# Patient Record
Sex: Male | Born: 1980 | Race: White | Hispanic: No | Smoking: Never smoker
Health system: Southern US, Community
[De-identification: ages and names within clinical notes are randomized; demographics above are authoritative.]

---

## 2005-07-08 ENCOUNTER — Ambulatory Visit (HOSPITAL_BASED_OUTPATIENT_CLINIC_OR_DEPARTMENT_OTHER): Admission: RE | Admit: 2005-07-08 | Discharge: 2005-07-08 | Payer: Self-pay | Admitting: Orthopaedic Surgery

## 2006-12-04 ENCOUNTER — Emergency Department (HOSPITAL_COMMUNITY): Admission: EM | Admit: 2006-12-04 | Discharge: 2006-12-05 | Payer: Self-pay | Admitting: Emergency Medicine

## 2006-12-06 ENCOUNTER — Emergency Department (HOSPITAL_COMMUNITY): Admission: EM | Admit: 2006-12-06 | Discharge: 2006-12-07 | Payer: Self-pay | Admitting: Emergency Medicine

## 2006-12-08 ENCOUNTER — Emergency Department (HOSPITAL_COMMUNITY): Admission: EM | Admit: 2006-12-08 | Discharge: 2006-12-08 | Payer: Self-pay | Admitting: Emergency Medicine

## 2009-05-29 ENCOUNTER — Emergency Department (HOSPITAL_COMMUNITY): Admission: EM | Admit: 2009-05-29 | Discharge: 2009-05-29 | Payer: Self-pay | Admitting: Emergency Medicine

## 2009-05-29 ENCOUNTER — Ambulatory Visit: Payer: Self-pay | Admitting: Diagnostic Radiology

## 2009-05-29 ENCOUNTER — Emergency Department (HOSPITAL_BASED_OUTPATIENT_CLINIC_OR_DEPARTMENT_OTHER): Admission: EM | Admit: 2009-05-29 | Discharge: 2009-05-29 | Payer: Self-pay | Admitting: Emergency Medicine

## 2010-04-24 LAB — URINALYSIS, ROUTINE W REFLEX MICROSCOPIC
Hgb urine dipstick: NEGATIVE
Nitrite: NEGATIVE
Protein, ur: NEGATIVE mg/dL
pH: 5.5 (ref 5.0–8.0)

## 2010-06-22 NOTE — Op Note (Signed)
NAME:  Randall Mckenzie, Randall Mckenzie               ACCOUNT NO.:  [id number]   MEDICAL RECORD NO.:  [id number]          PATIENT TYPE:  AMB   LOCATION:  [facility]                         FACILITY:  [facility]   PHYSICIAN:  Sharolyn Douglas, M.D.        DATE OF BIRTH:  04-01-1980   DATE OF PROCEDURE:  07/08/2005  DATE OF DISCHARGE:                                 OPERATIVE REPORT   DIAGNOSIS:  Lumbar degenerative disk disease, chronic back and leg pain.   PROCEDURE:  1.  Bilateral L4-5 facet injections.  2.  Lumbar epidural steroid injection.  3.  Fluoroscopy used for facet injections and epidural injection.   SURGEON:  Sharolyn Douglas, M.D.   ASSISTANT:  None.   ANESTHESIA:  MAC plus local.   COMPLICATIONS:  None.   ESTIMATED BLOOD LOSS:  None.   INDICATIONS:  Patient is a pleasant 30 year old male with persistent back  and bilateral leg pain thought to be secondary to degenerative disk disease  at L4-5 and facet arthropathy.  He has failed to respond to all conservative  treatment and now elected to undergo facet injections and epidural injection  in hopes of improving his pain.  Risks and benefits were reviewed.   PROCEDURE:  The patient was identified in the holding area, taken to the  operating room.  He underwent sedation by anesthesia.  He was turned prone.  The back was prepped and draped in the usual sterile fashion.  Fluoroscopy  was brought into the field, and the L4-5 facet joints were visualized on AP  fluoroscopy.  The overlying skin was anesthetized with 1 cc of 1% lidocaine.  A 22 gauge Quincke spinal needle advanced towards the left L4-5 facet joint.  A similar procedure was carried out on the right side of L4-5.  Each joint  was then injected with a 0.5 cc of Omnipaque, confirming intra-articular  spread.  The joints were then injected with a solution of 20 mg of Depo-  Medrol and 3 cc of 1% lidocaine.  We then turned our attention to performing  a lumbar epidural steroid injection at L4-5.  Using  AP fluoroscopy, a 22  gauge Tuohy needle was advanced towards the interlaminar space.  Using  lateral fluoroscopy and the loss of resistance technique, the epidural space  was entered.  Aspiration was performed.  There was no blood or CSF.  Omnipaque 1 cc injected.  There was good epidural spread.  We then injected  a solution of 80 mg of Depo-Medrol, 5 cc of preservative-free lidocaine.  Band-Aids were placed over the puncture sites.  Patient  was turned supine, transferred to recovery in stable condition,  neurologically intact.  Post injection instructions were reviewed.  He will  follow up in two weeks.  A prescription for Vicodin provided, 5 mg, 40  tablets to use as needed for pain.      Sharolyn Douglas, M.D.  Electronically Signed     [facility]/MED[facility]  D:  07/08/2005  T:  07/09/2005  Job:  [id number]

## 2010-11-13 LAB — CULTURE, ROUTINE-ABSCESS

## 2015-08-31 ENCOUNTER — Encounter (HOSPITAL_COMMUNITY): Payer: Self-pay

## 2015-08-31 ENCOUNTER — Emergency Department (HOSPITAL_COMMUNITY)
Admission: EM | Admit: 2015-08-31 | Discharge: 2015-08-31 | Disposition: A | Discharge status: Home / Self Care | Payer: Self-pay | Attending: Emergency Medicine | Admitting: Emergency Medicine

## 2015-08-31 ENCOUNTER — Emergency Department (HOSPITAL_COMMUNITY): Payer: Self-pay

## 2015-08-31 DIAGNOSIS — Y939 Activity, unspecified: Secondary | ICD-10-CM | POA: Insufficient documentation

## 2015-08-31 DIAGNOSIS — Y999 Unspecified external cause status: Secondary | ICD-10-CM | POA: Insufficient documentation

## 2015-08-31 DIAGNOSIS — S161XXA Strain of muscle, fascia and tendon at neck level, initial encounter: Secondary | ICD-10-CM | POA: Insufficient documentation

## 2015-08-31 DIAGNOSIS — Y9289 Other specified places as the place of occurrence of the external cause: Secondary | ICD-10-CM | POA: Insufficient documentation

## 2015-08-31 MED ORDER — NAPROXEN 500 MG PO TABS: 500.0000 mg | ORAL_TABLET | Freq: Two times a day (BID) | ORAL | 0 refills | Status: DC

## 2015-08-31 MED ORDER — NAPROXEN 250 MG PO TABS
500.0000 mg | ORAL_TABLET | Freq: Once | ORAL | Status: AC
  Administered 2015-08-31: 500 mg via ORAL
  Filled 2015-08-31: qty 2

## 2015-08-31 MED ORDER — CYCLOBENZAPRINE HCL 5 MG PO TABS: 5.0000 mg | ORAL_TABLET | Freq: Three times a day (TID) | ORAL | 0 refills | Status: DC | PRN

## 2015-08-31 NOTE — Discharge Instructions (Signed)
Mr. Dantin,  Your neck x-ray was negative for malalignment or fracture. Taking NSAIDs like ibuprofen can help with inflammation. I have prescribed a strong NSAID called naproxen and a muscle relaxant called flexeril. The flexeril can make you sleepy, so do not drive with this medication. If you develop vomiting or worsening headache, please be evaluated again. Pain will likely continue for weeks but should slowly improve with time.
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
## 2015-08-31 NOTE — ED Notes (Signed)
Report given to Chelsea, RN
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
## 2015-08-31 NOTE — ED Notes (Signed)
MD Skeet Simmer at the bedside

## 2015-11-18 ENCOUNTER — Emergency Department (HOSPITAL_COMMUNITY): Payer: Self-pay

## 2015-11-18 ENCOUNTER — Encounter (HOSPITAL_COMMUNITY): Payer: Self-pay

## 2015-11-18 ENCOUNTER — Emergency Department (HOSPITAL_COMMUNITY)
Admission: EM | Admit: 2015-11-18 | Discharge: 2015-11-19 | Disposition: A | Discharge status: Home / Self Care | Payer: Self-pay | Attending: Emergency Medicine | Admitting: Emergency Medicine

## 2015-11-18 DIAGNOSIS — J020 Streptococcal pharyngitis: Secondary | ICD-10-CM | POA: Insufficient documentation

## 2015-11-18 DIAGNOSIS — R509 Fever, unspecified: Secondary | ICD-10-CM

## 2015-11-18 LAB — CBC WITH DIFFERENTIAL/PLATELET
Basophils Absolute: 0 10*3/uL (ref 0.0–0.1)
Basophils Relative: 0 %
Eosinophils Absolute: 0 10*3/uL (ref 0.0–0.7)
Eosinophils Relative: 0 %
HCT: 43.2 % (ref 39.0–52.0)
Hemoglobin: 15.1 g/dL (ref 13.0–17.0)
Lymphocytes Relative: 9 %
Lymphs Abs: 1.6 10*3/uL (ref 0.7–4.0)
MCH: 31.4 pg (ref 26.0–34.0)
MCHC: 35 g/dL (ref 30.0–36.0)
MCV: 89.8 fL (ref 78.0–100.0)
Monocytes Absolute: 1.9 10*3/uL — ABNORMAL HIGH (ref 0.1–1.0)
Monocytes Relative: 10 %
Neutro Abs: 15.2 10*3/uL — ABNORMAL HIGH (ref 1.7–7.7)
Neutrophils Relative %: 81 %
Platelets: 163 10*3/uL (ref 150–400)
RBC: 4.81 MIL/uL (ref 4.22–5.81)
RDW: 12.2 % (ref 11.5–15.5)
WBC: 18.7 10*3/uL — ABNORMAL HIGH (ref 4.0–10.5)

## 2015-11-18 LAB — URINALYSIS, ROUTINE W REFLEX MICROSCOPIC
Bilirubin Urine: NEGATIVE
Glucose, UA: NEGATIVE mg/dL
Hgb urine dipstick: NEGATIVE
Ketones, ur: NEGATIVE mg/dL
Leukocytes, UA: NEGATIVE
Nitrite: NEGATIVE
Protein, ur: 30 mg/dL — AB
Specific Gravity, Urine: 1.018 (ref 1.005–1.030)
pH: 5.5 (ref 5.0–8.0)

## 2015-11-18 LAB — URINE MICROSCOPIC-ADD ON: Bacteria, UA: NONE SEEN

## 2015-11-18 LAB — COMPREHENSIVE METABOLIC PANEL
ALK PHOS: 67 U/L (ref 38–126)
ALT: 29 U/L (ref 17–63)
ANION GAP: 10 (ref 5–15)
AST: 27 U/L (ref 15–41)
Albumin: 3.9 g/dL (ref 3.5–5.0)
BILIRUBIN TOTAL: 0.7 mg/dL (ref 0.3–1.2)
BUN: 12 mg/dL (ref 6–20)
CALCIUM: 9 mg/dL (ref 8.9–10.3)
CO2: 25 mmol/L (ref 22–32)
Chloride: 100 mmol/L — ABNORMAL LOW (ref 101–111)
Creatinine, Ser: 1.24 mg/dL (ref 0.61–1.24)
GFR calc non Af Amer: >60 mL/min (ref >60)
Glucose, Bld: 100 mg/dL — ABNORMAL HIGH (ref 65–99)
POTASSIUM: 3.4 mmol/L — AB (ref 3.5–5.1)
SODIUM: 135 mmol/L (ref 135–145)
TOTAL PROTEIN: 8 g/dL (ref 6.5–8.1)

## 2015-11-18 LAB — I-STAT CG4 LACTIC ACID, ED
LACTIC ACID, VENOUS: 1.3 mmol/L (ref 0.5–1.9)
Lactic Acid, Venous: 1.39 mmol/L (ref 0.5–1.9)

## 2015-11-18 LAB — RAPID STREP SCREEN (MED CTR MEBANE ONLY): Streptococcus, Group A Screen (Direct): POSITIVE — AB

## 2015-11-18 MED ORDER — ACETAMINOPHEN 325 MG PO TABS
ORAL_TABLET | ORAL | Status: AC
  Filled 2015-11-18: qty 2

## 2015-11-18 MED ORDER — ACETAMINOPHEN 325 MG PO TABS
650.0000 mg | ORAL_TABLET | Freq: Once | ORAL | Status: AC
  Administered 2015-11-18: 650 mg via ORAL

## 2015-11-18 MED ORDER — PENICILLIN G BENZATHINE 1200000 UNIT/2ML IM SUSP
1.2000 10*6.[IU] | Freq: Once | INTRAMUSCULAR | Status: AC
  Administered 2015-11-19: 1.2 10*6.[IU] via INTRAMUSCULAR
  Filled 2015-11-18: qty 2

## 2015-11-18 MED ORDER — SODIUM CHLORIDE 0.9 % IV BOLUS (SEPSIS)
1000.0000 mL | Freq: Once | INTRAVENOUS | Status: AC
  Administered 2015-11-18: 1000 mL via INTRAVENOUS

## 2015-11-18 NOTE — ED Triage Notes (Signed)
Onset last night 10p fever, body aches and dizziness.  Temp @ 4am was 104.  Pt has did cold showers, cold packs.  NO other s/s noted.  Pt last took Tylenol 3 hours ago.

## 2015-11-18 NOTE — ED Provider Notes (Signed)
[facility] Provider Note   CSN: [id number] Arrival date & time: 11/18/15  1834   By signing my name below, I, Randall Mckenzie, attest that this documentation has been prepared under the direction and in the presence of Geoffery Lyons, MD. Electronically Signed: Soijett Mckenzie, ED Scribe. 11/18/15. 11:12 PM.   History   Chief Complaint Chief Complaint  Patient presents with  . Fever  . Dizziness    HPI  Randall Mckenzie is a 35 y.o. male who presents to the Emergency Department complaining of fever onset 10 PM last night. Pt reports that he last took his temperature at 4 PM today and his fever was 104. Pt notes that his symptoms began with HA and dizziness last night. Pt notes that he had similar symptoms occur several years ago where he was admitted with a temp of 106 and he is unaware of what the cause of his symptoms were. 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  He states that he is having associated symptoms of diaphoresis, dizziness, HA, and body aches. He states that he has tried advil for the relief of his symptoms. He denies sore throat, cough, vomiting, diarrhea, and any other symptoms.   The history is provided by the patient. No language interpreter was used.    History reviewed. No pertinent past medical history.  There are no active problems to display for this patient.   History reviewed. No pertinent surgical history.     Home Medications    Prior to Admission medications   Medication Sig Start Date End Date Taking? Authorizing Provider  cyclobenzaprine (FLEXERIL) 5 MG tablet Take 1 tablet (5 mg total) by mouth 3 (three) times daily as needed for muscle spasms. 08/31/15   Hillary Percell Boston, MD  naproxen (NAPROSYN) 500 MG tablet Take 1 tablet (500 mg total) by mouth 2 (two) times daily with a meal. 08/31/15   Casey Burkitt, MD    Family History History  reviewed. No pertinent family history.  Social History Social History  Substance Use Topics  . Smoking status: Never Smoker  . Smokeless tobacco: Never Used  . Alcohol use No     Allergies   Review of patient's allergies indicates no known allergies.   Review of Systems Review of Systems  All other systems reviewed and are negative.    Physical Exam Updated Vital Signs BP 124/80   Pulse 117   Temp 98.9 F (37.2 C) (Oral)   Resp 20   SpO2 96%   Physical Exam  Constitutional: He is oriented to person, place, and time. He appears well-developed and well-nourished. No distress.  HENT:  Head: Normocephalic and atraumatic.  Right Ear: Hearing normal.  Left Ear: Hearing normal.  Nose: Nose normal.  Mouth/Throat: Mucous membranes are normal. Oropharyngeal exudate and posterior oropharyngeal erythema present.  Slight exudates to bilateral tonsils. Mild erythema of the PO.  Eyes: Conjunctivae and EOM are normal. Pupils are equal, round, and reactive to light.  Neck: Normal range of motion. Neck supple.  Cardiovascular: Normal rate, regular rhythm, S1 normal, S2 normal and normal heart sounds.  Exam reveals no gallop and no friction rub.   No murmur heard. Pulmonary/Chest: Effort normal and breath sounds normal. No respiratory distress. He has no wheezes. He has no rales. He exhibits no tenderness.  Abdominal: Soft. Normal appearance and bowel sounds are normal. There is no hepatosplenomegaly. There is no tenderness.  There is no rebound, no guarding, no tenderness at McBurney's point and negative Murphy's sign. No hernia.  Musculoskeletal: Normal range of motion.  Neurological: He is alert and oriented to person, place, and time. He has normal strength. No cranial nerve deficit or sensory deficit. Coordination normal. GCS eye subscore is 4. GCS verbal subscore is 5. GCS motor subscore is 6.  Skin: Skin is warm, dry and intact. No rash noted. No cyanosis.  Psychiatric: He has a  normal mood and affect. His speech is normal and behavior is normal. Thought content normal.  Nursing note and vitals reviewed.    ED Treatments / Results  DIAGNOSTIC STUDIES: Oxygen Saturation is 96% on RA, nl by my interpretation.    COORDINATION OF CARE: 11:11 PM Discussed treatment plan with pt at bedside which includes labs, CXR, rapid strep screen, and pt agreed to plan.   Labs (all labs ordered are listed, but only abnormal results are displayed) Labs Reviewed  RAPID STREP SCREEN (NOT AT [facility]) - Abnormal; Notable for the following:       Result Value   Streptococcus, Group A Screen (Direct) POSITIVE (*)    All other components within normal limits  COMPREHENSIVE METABOLIC PANEL - Abnormal; Notable for the following:    Potassium 3.4 (*)    Chloride 100 (*)    Glucose, Bld 100 (*)    All other components within normal limits  URINALYSIS, ROUTINE W REFLEX MICROSCOPIC (NOT AT [facility]) - Abnormal; Notable for the following:    Protein, ur 30 (*)    All other components within normal limits  CBC WITH DIFFERENTIAL/PLATELET - Abnormal; Notable for the following:    WBC 18.7 (*)    Neutro Abs 15.2 (*)    Monocytes Absolute 1.9 (*)    All other components within normal limits  URINE MICROSCOPIC-ADD ON - Abnormal; Notable for the following:    Squamous Epithelial / LPF 0-5 (*)    All other components within normal limits  CULTURE, BLOOD (ROUTINE X 2)  CULTURE, BLOOD (ROUTINE X 2)  URINE CULTURE  MONONUCLEOSIS SCREEN  I-STAT CG4 LACTIC ACID, ED  I-STAT CG4 LACTIC ACID, ED    EKG  EKG Interpretation None       Radiology Dg Chest 2 View  Result Date: 11/18/2015 CLINICAL DATA:  Fever and body aches for 1 day. EXAM: CHEST  2 VIEW COMPARISON:  None. FINDINGS: The cardiomediastinal silhouette is unremarkable. Mild peribronchial thickening is noted. There is no evidence of focal airspace disease, pulmonary edema, suspicious pulmonary nodule/mass, pleural effusion, or  pneumothorax. No acute bony abnormalities are identified. IMPRESSION: Mild peribronchial thickening - likely chronic. No other significant abnormalities. Electronically Signed   By: Harmon PierJeffrey  Hu M.D.   On: 11/18/2015 20:05    Procedures Procedures (including critical care time)  Medications Ordered in ED Medications  acetaminophen (TYLENOL) tablet 650 mg (650 mg Oral Given 11/18/15 2120)  sodium chloride 0.9 % bolus 1,000 mL (1,000 mLs Intravenous New Bag/Given 11/18/15 2330)     Initial Impression / Assessment and Plan / ED Course  I have reviewed the triage vital signs and the nursing notes.  Pertinent labs & imaging results that were available during my care of the patient were reviewed by me and considered in my medical decision making (see chart for details).  Clinical Course    Patient presents with complaints of fever. He was found to be tachycardic in triage and a septic workup was initiated. 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 His examination reveals erythema of the oropharynx with slight exudates on the tonsils. A rapid strep swab was obtained and was positive. He was given Bicillin, intravenous fluids, and will be discharged. He is currently afebrile. To return as needed for any problems.  Final Clinical Impressions(s) / ED Diagnoses   Final diagnoses:  None    New Prescriptions New Prescriptions   No medications on file    I personally performed the services described in this documentation, which was scribed in my presence. The recorded information has been reviewed and is accurate.        Geoffery Lyons, MD 11/19/15 [phone]

## 2015-11-18 NOTE — ED Notes (Signed)
Pt to xray

## 2015-11-19 LAB — MONONUCLEOSIS SCREEN: Mono Screen: NEGATIVE

## 2015-11-19 NOTE — Discharge Instructions (Signed)
Alternate ibuprofen 600 mg with Tylenol 1000 mg every 4 hours as needed for pain or fever.  Return to the emergency department if you develop and inability to swallow, difficulty breathing, or other new and concerning symptoms.

## 2015-11-20 LAB — URINE CULTURE: Culture: NO GROWTH

## 2015-11-23 LAB — CULTURE, BLOOD (ROUTINE X 2)
CULTURE: NO GROWTH
Culture: NO GROWTH

## 2016-07-11 ENCOUNTER — Emergency Department (HOSPITAL_COMMUNITY)
Admission: EM | Admit: 2016-07-11 | Discharge: 2016-07-11 | Disposition: A | Discharge status: Home / Self Care | Payer: Self-pay | Attending: Emergency Medicine | Admitting: Emergency Medicine

## 2016-07-11 ENCOUNTER — Emergency Department (HOSPITAL_COMMUNITY): Payer: Self-pay

## 2016-07-11 ENCOUNTER — Encounter (HOSPITAL_COMMUNITY): Payer: Self-pay | Admitting: Vascular Surgery

## 2016-07-11 ENCOUNTER — Ambulatory Visit (HOSPITAL_COMMUNITY)
Admission: EM | Admit: 2016-07-11 | Discharge: 2016-07-11 | Disposition: A | Discharge status: Home / Self Care | Payer: Self-pay

## 2016-07-11 DIAGNOSIS — N50812 Left testicular pain: Secondary | ICD-10-CM | POA: Insufficient documentation

## 2016-07-11 DIAGNOSIS — N5089 Other specified disorders of the male genital organs: Secondary | ICD-10-CM

## 2016-07-11 DIAGNOSIS — N451 Epididymitis: Secondary | ICD-10-CM

## 2016-07-11 DIAGNOSIS — Z79899 Other long term (current) drug therapy: Secondary | ICD-10-CM | POA: Insufficient documentation

## 2016-07-11 LAB — URINALYSIS, ROUTINE W REFLEX MICROSCOPIC
Bilirubin Urine: NEGATIVE
Glucose, UA: NEGATIVE mg/dL
Hgb urine dipstick: NEGATIVE
Ketones, ur: NEGATIVE mg/dL
LEUKOCYTES UA: NEGATIVE
NITRITE: NEGATIVE
PH: 5 (ref 5.0–8.0)
Protein, ur: NEGATIVE mg/dL
SPECIFIC GRAVITY, URINE: 1.026 (ref 1.005–1.030)

## 2016-07-11 MED ORDER — HYDROCODONE-ACETAMINOPHEN 5-325 MG PO TABS
1.0000 | ORAL_TABLET | Freq: Four times a day (QID) | ORAL | 0 refills | Status: DC | PRN
Start: 2016-07-11 — End: 2023-07-26

## 2016-07-11 MED ORDER — LEVOFLOXACIN 500 MG PO TABS: 500.0000 mg | ORAL_TABLET | Freq: Every day | ORAL | 0 refills | Status: DC

## 2016-07-11 MED ORDER — NAPROXEN 500 MG PO TABS: 500.0000 mg | ORAL_TABLET | Freq: Two times a day (BID) | ORAL | 0 refills | Status: DC

## 2016-07-11 MED ORDER — LEVOFLOXACIN 500 MG PO TABS
500.0000 mg | ORAL_TABLET | Freq: Once | ORAL | Status: AC
  Administered 2016-07-11: 500 mg via ORAL
  Filled 2016-07-11: qty 1

## 2016-07-11 NOTE — ED Notes (Signed)
Pt ambulatory to nurse first, pt requesting update for when he may go back to a room, pt updated, this RN apologized for patient's wait and assured him we would get him back as soon as possible.

## 2016-07-11 NOTE — ED Provider Notes (Signed)
[facility]-EMERGENCY DEPT Provider Note   CSN: [id number] Arrival date & time: 07/11/16  1120  By signing my name below, I, Rosana Fret, attest that this documentation has been prepared under the direction and in the presence of non-physician practitioner, Jaynie Crumble, PA-C. Electronically Signed: Rosana Fret, ED Scribe. 07/11/16. 2:41 PM.  History   Chief Complaint Chief Complaint  Patient presents with  . Groin Swelling   The history is provided by the patient. No language interpreter was used.  HPI Comments: Randall Mckenzie is a 36 y.o. male with a PMHx of testicular torsion, who presents to the Emergency Department complaining of a moderate, gradually worsening area of pain and swelling to the left testicle onset 12 hours ago. No trauma to the area. Pt states pain is exacerbated by ambulation. No treatment tried prior to arrival in the ED. Pt denies penile discharge, urinary symptoms or any other complaints at this time. No fever or chills.  NKDA.   History reviewed. No pertinent past medical history.  There are no active problems to display for this patient.   History reviewed. No pertinent surgical history.     Home Medications    Prior to Admission medications   Medication Sig Start Date End Date Taking? Authorizing Provider  cyclobenzaprine (FLEXERIL) 5 MG tablet Take 1 tablet (5 mg total) by mouth 3 (three) times daily as needed for muscle spasms. 08/31/15   Casey Burkitt, MD  naproxen (NAPROSYN) 500 MG tablet Take 1 tablet (500 mg total) by mouth 2 (two) times daily with a meal. 08/31/15   Casey Burkitt, MD    Family History No family history on file.  Social History Social History  Substance Use Topics  . Smoking status: Never Smoker  . Smokeless tobacco: Never Used  . Alcohol use No     Allergies   Patient has no known allergies.   Review of Systems Review of Systems  Genitourinary: Positive for scrotal swelling and  testicular pain. Negative for difficulty urinating, discharge, dysuria, enuresis, frequency and urgency.     Physical Exam Updated Vital Signs BP (!) 134/92 (BP Location: Right Arm)   Pulse 85   Temp 98.5 F (36.9 C) (Oral)   Resp 16   SpO2 98%   Physical Exam  Constitutional: He is oriented to person, place, and time. He appears well-developed and well-nourished.  HENT:  Head: Normocephalic and atraumatic.  Cardiovascular: Normal rate.   Pulmonary/Chest: Effort normal.  Genitourinary:  Genitourinary Comments: Exam performed with chaperone present. Normal right testicle. Left testicle is mildly swollen, tender to the touch diffusely, no palpable skin induration or fluctuance. No masses. Cremasteric reflex present bilaterally.  Neurological: He is alert and oriented to person, place, and time.  Skin: Skin is warm and dry.  Psychiatric: He has a normal mood and affect.  Nursing note and vitals reviewed.    ED Treatments / Results  DIAGNOSTIC STUDIES: Oxygen Saturation is 98% on RA, normal by my interpretation.   COORDINATION OF CARE: 2:39 PM-Discussed next steps with pt including diagnosis of ependymitis and prescription for antibiotics. Pt verbalized understanding and is agreeable with the plan.   Labs (all labs ordered are listed, but only abnormal results are displayed) Labs Reviewed  URINALYSIS, ROUTINE W REFLEX MICROSCOPIC    EKG  EKG Interpretation None       Radiology US Scrotum  Result Date: 07/11/2016 CLINICAL DATA:  Evaluate for testicular torsion. EXAM: SCROTAL ULTRASOUND DOPPLER ULTRASOUND OF THE TESTICLES TECHNIQUE: Complete ultrasound  examination of the testicles, epididymis, and other scrotal structures was performed. Color and spectral Doppler ultrasound were also utilized to evaluate blood flow to the testicles. COMPARISON:  CT 05/29/2009. FINDINGS: Right testicle Measurements: 4.1 x 2.3 x 2.6 cm. No mass or microlithiasis visualized. Left testicle  Measurements: 4.7 x 2.4 x 3.0 cm. No mass or microlithiasis visualized. Right epididymis:  Normal in size and appearance. Left epididymis: Left epididymis is prominent with increased flow. Increased flow also noted in the left testicle. These findings suggest the possibility of left epididymitis/ orchitis. Hydrocele:  Small left hydrocele. Varicocele:  None visualized. Pulsed Doppler interrogation of both testes demonstrates normal low resistance arterial and venous waveforms bilaterally. Increase flow on the left as stated above noted . IMPRESSION: 1. The left epididymis is prominent with increased Doppler flow. Increase flows also none the left testicle. These findings suggests possibly of left epididymitis/ orchitis. Small left hydrocele. 2.  No evidence of testicular mass or torsion. Electronically Signed   By: Maisie Fushomas  Register   On: 07/11/2016 12:51   [place] Art/ven Flow Abd Pelv Doppler  Result Date: 07/11/2016 CLINICAL DATA:  Evaluate for testicular torsion. EXAM: SCROTAL ULTRASOUND DOPPLER ULTRASOUND OF THE TESTICLES TECHNIQUE: Complete ultrasound examination of the testicles, epididymis, and other scrotal structures was performed. Color and spectral Doppler ultrasound were also utilized to evaluate blood flow to the testicles. COMPARISON:  CT 05/29/2009. FINDINGS: Right testicle Measurements: 4.1 x 2.3 x 2.6 cm. No mass or microlithiasis visualized. Left testicle Measurements: 4.7 x 2.4 x 3.0 cm. No mass or microlithiasis visualized. Right epididymis:  Normal in size and appearance. Left epididymis: Left epididymis is prominent with increased flow. Increased flow also noted in the left testicle. These findings suggest the possibility of left epididymitis/ orchitis. Hydrocele:  Small left hydrocele. Varicocele:  None visualized. Pulsed Doppler interrogation of both testes demonstrates normal low resistance arterial and venous waveforms bilaterally. Increase flow on the left as stated above noted . IMPRESSION:  1. The left epididymis is prominent with increased Doppler flow. Increase flows also none the left testicle. These findings suggests possibly of left epididymitis/ orchitis. Small left hydrocele. 2.  No evidence of testicular mass or torsion. Electronically Signed   By: Maisie Fushomas  Register   On: 07/11/2016 12:51    Procedures Procedures (including critical care time)  Medications Ordered in ED Medications - No data to display   Initial Impression / Assessment and Plan / ED Course  I have reviewed the triage vital signs and the nursing notes.  Pertinent labs & imaging results that were available during my care of the patient were reviewed by me and considered in my medical decision making (see chart for details).     Patient in emergency department with acute onset of left testicle pain. Reports possible torsion several years ago but states he has not needed surgery for that. Ultrasound performed in triage showing findings consistent with epididymitis/orchitis. Urinalysis is negative. Patient is nontoxic-appearing, afebrile, normal vital signs. No evidence of systemic infection. No evidence of abscess on exam. Will start on levofloxacin, home with a urology follow-up. Strict return precautions discussed. Patient denies any possibility of STD, gonorrhea and Chlamydia culture sent.  Vitals:   07/11/16 1135  BP: (!) 134/92  Pulse: 85  Resp: 16  Temp: 98.5 F (36.9 C)  TempSrc: Oral  SpO2: 98%    Final Clinical Impressions(s) / ED Diagnoses   Final diagnoses:  Testicle swelling  Epididymitis, bilateral    New Prescriptions New Prescriptions  HYDROCODONE-ACETAMINOPHEN (NORCO) 5-325 MG TABLET    Take 1 tablet by mouth every 6 (six) hours as needed for moderate pain.   LEVOFLOXACIN (LEVAQUIN) 500 MG TABLET    Take 1 tablet (500 mg total) by mouth daily.   NAPROXEN (NAPROSYN) 500 MG TABLET    Take 1 tablet (500 mg total) by mouth 2 (two) times daily.   I personally performed the  services described in this documentation, which was scribed in my presence. The recorded information has been reviewed and is accurate.     Jaynie Crumble, PA-C 07/11/16 1453    Tilden Fossa, MD 07/12/16 [phone]

## 2016-07-11 NOTE — ED Triage Notes (Signed)
Complains of testicular swelling.  Onset 3 am this morning.  Patient does have pain

## 2016-07-11 NOTE — Discharge Instructions (Signed)
Take levaquin as prescribed until all gone. Elevate and support scrotum. Naprosyn for pain and inflammation. Take norco for severe pain only. Follow up with urology

## 2016-07-11 NOTE — ED Triage Notes (Signed)
Pt reports to the ED for eval of swollen left testicle. [facility] and was sent here for an U/S. Onset swelling was around 3 am this am. Onset of pain was gradual. Pt denies any trauma or injury to his testicles. Pt has hx of testicular torsion. He states this pain is different but [facility].

## 2016-07-11 NOTE — ED Notes (Signed)
Ander SladeBill oxford, np spoke with patient.  Patient chose to go to ed for further evaluation
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
## 2018-03-11 IMAGING — US US SCROTUM
1 series · 13 of 25 positions shown · non-contrast
Comparison: CT 05/29/2009.

CLINICAL DATA: Evaluate for testicular torsion.

EXAM:
SCROTAL ULTRASOUND
DOPPLER ULTRASOUND OF THE TESTICLES
TECHNIQUE: Complete ultrasound examination of the testicles, epididymis, and
other scrotal structures was performed. Color and spectral Doppler
ultrasound were also utilized to evaluate blood flow to the
testicles.

[Series 1: us scrotum · 0.06mm/px · 13 of 61 slices shown]
[im 1/61]
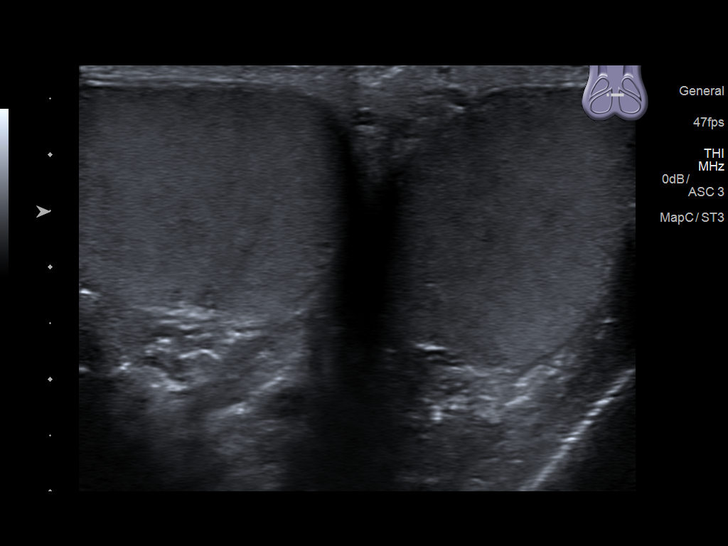
[im 6/61]
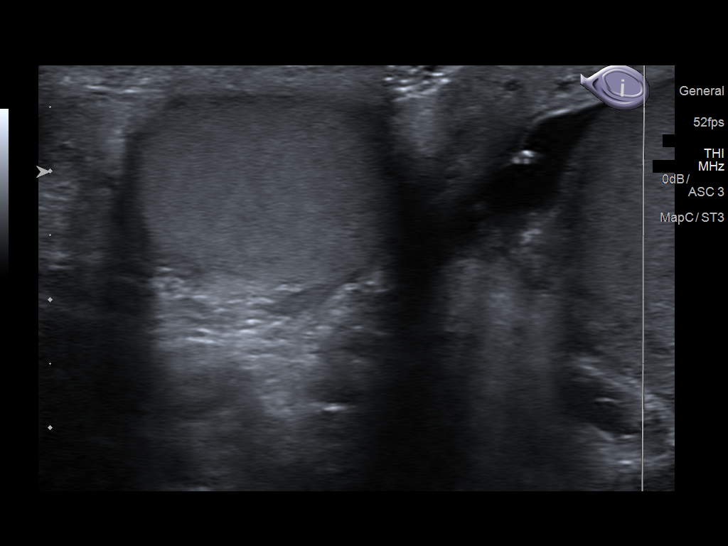
[im 11/61]
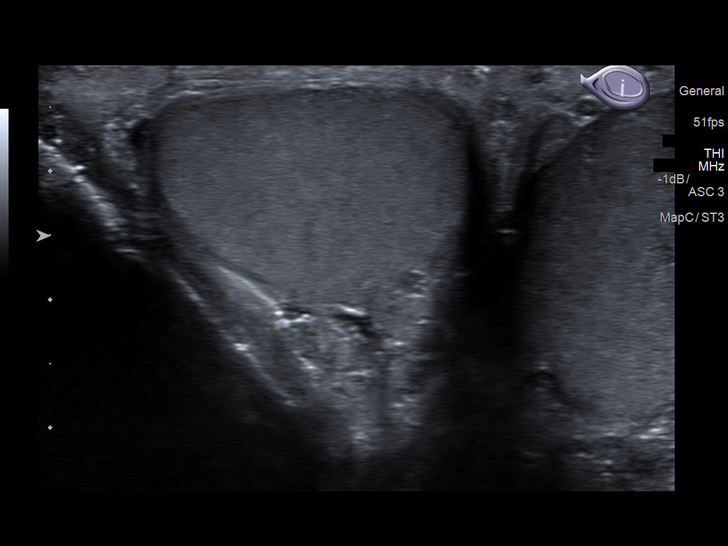
[im 16/61]
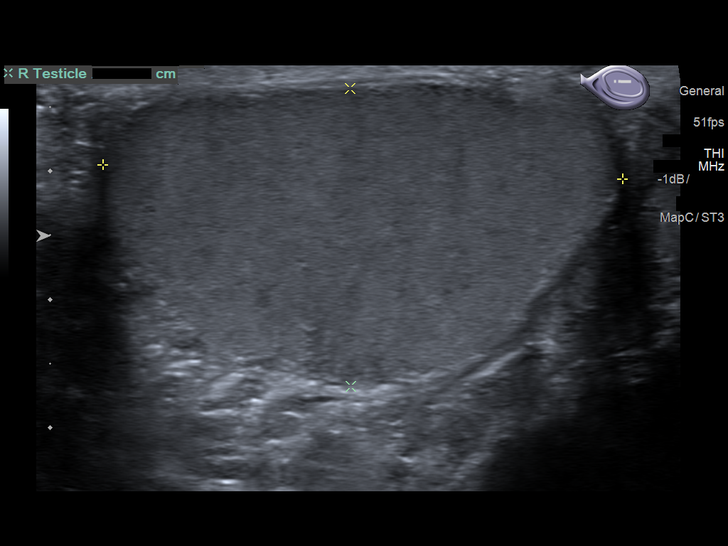
[im 21/61]
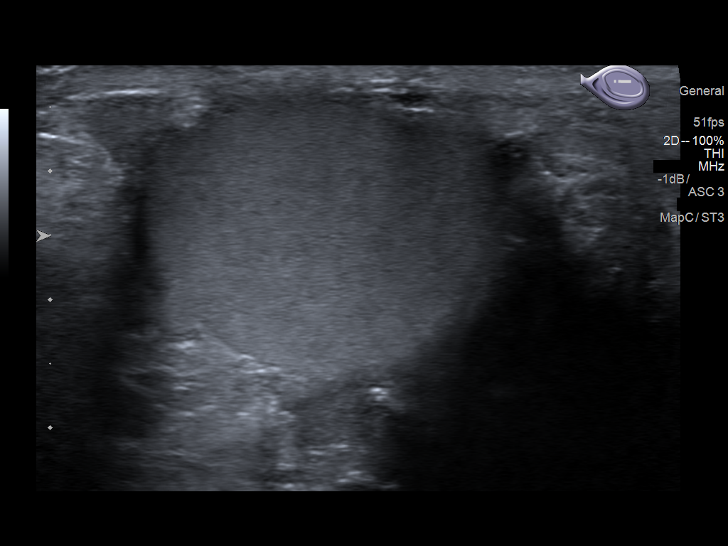
[im 26/61]
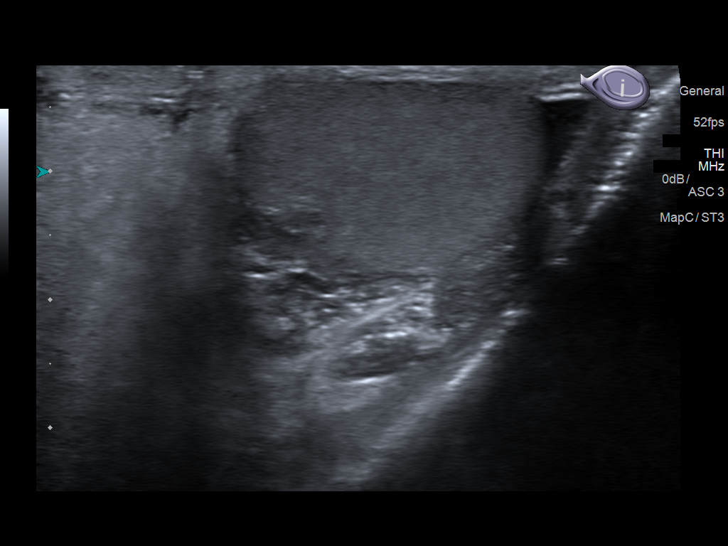
[im 31/61]
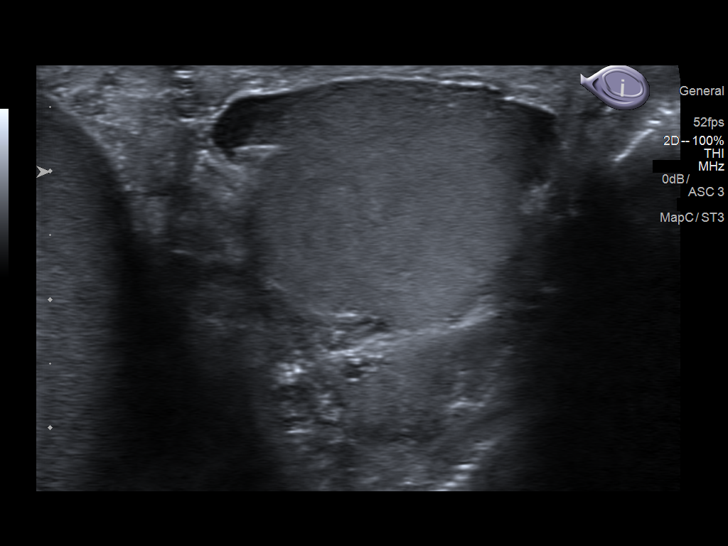
[im 36/61]
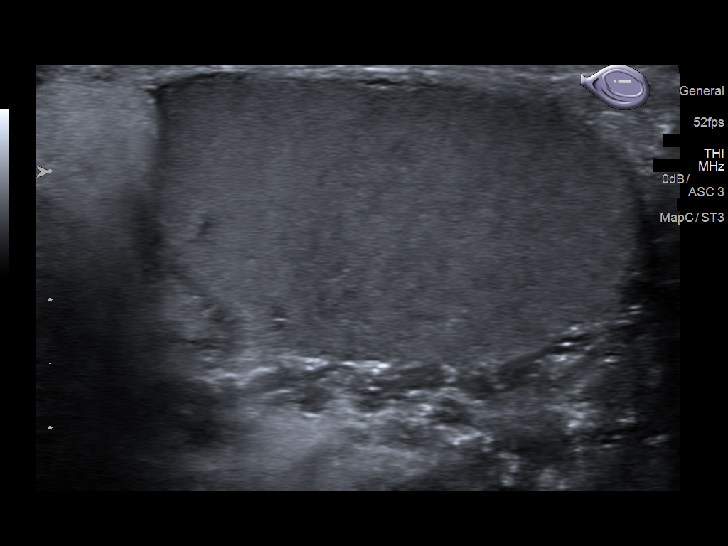
[im 41/61]
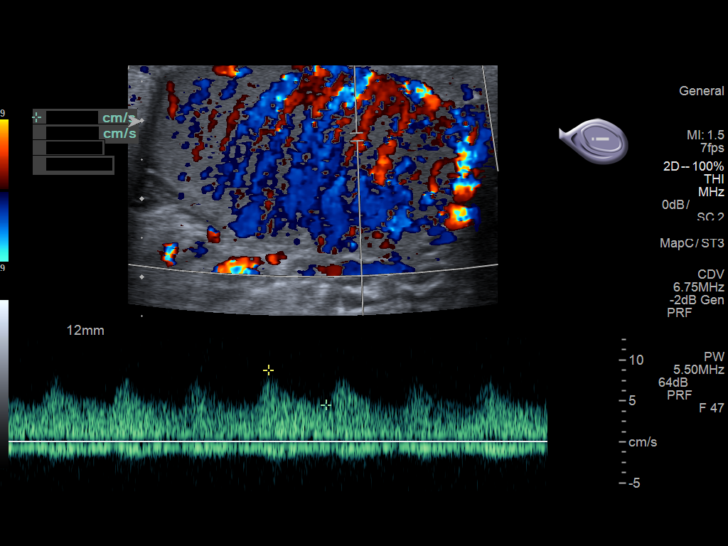
[im 46/61]
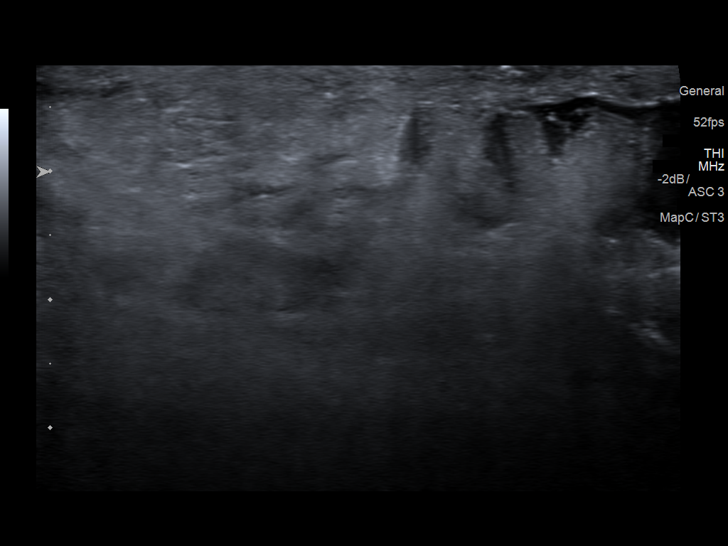
[im 51/61]
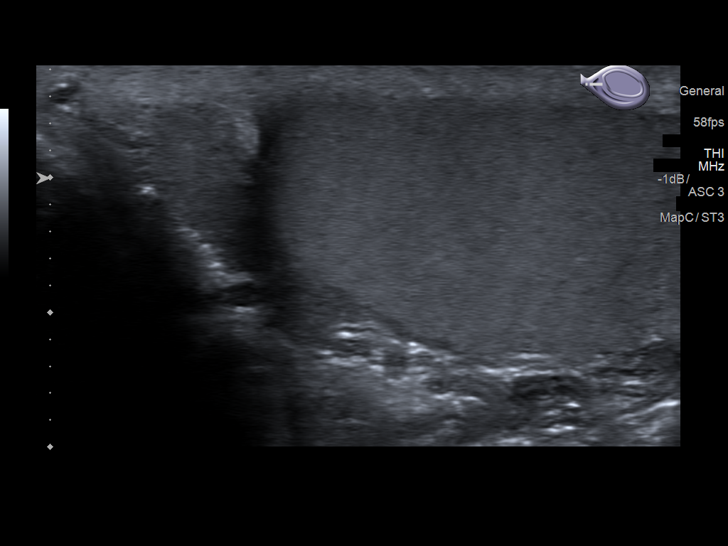
[im 56/61]
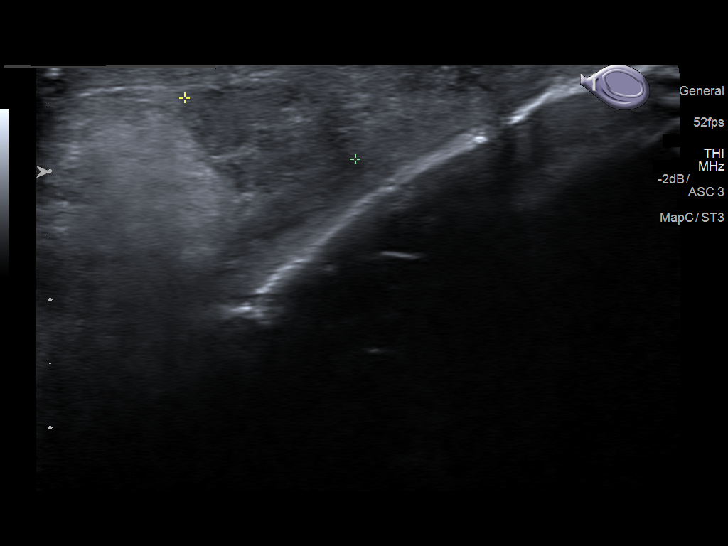
[im 61/61]
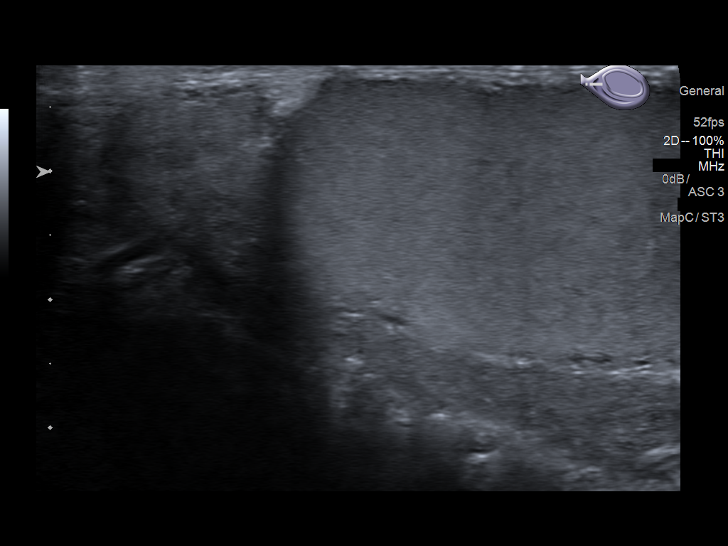

[13 of 25 positions shown; findings below may reference images not displayed]

FINDINGS: Right testicle

Measurements: 4.1 x 2.3 x 2.6 cm. No mass or microlithiasis
visualized.

Left testicle

Measurements: 4.7 x 2.4 x 3.0 cm. No mass or microlithiasis
visualized.

Right epididymis:  Normal in size and appearance.

Left epididymis: Left epididymis is prominent with increased flow.
Increased flow also noted in the left testicle. These findings
suggest the possibility of left epididymitis/ orchitis.

Hydrocele:  Small left hydrocele.

Varicocele:  None visualized.

Pulsed Doppler interrogation of both testes demonstrates normal low
resistance arterial and venous waveforms bilaterally. Increase flow
on the left as stated above noted .
IMPRESSION: 1. The left epididymis is prominent with increased Doppler flow.
Increase flows also none the left testicle. These findings suggests
possibly of left epididymitis/ orchitis. Small left hydrocele.

2.  No evidence of testicular mass or torsion.

## 2023-07-26 ENCOUNTER — Encounter (HOSPITAL_BASED_OUTPATIENT_CLINIC_OR_DEPARTMENT_OTHER): Payer: Self-pay | Admitting: Emergency Medicine

## 2023-07-26 ENCOUNTER — Emergency Department (HOSPITAL_BASED_OUTPATIENT_CLINIC_OR_DEPARTMENT_OTHER): Payer: Self-pay

## 2023-07-26 ENCOUNTER — Emergency Department (HOSPITAL_BASED_OUTPATIENT_CLINIC_OR_DEPARTMENT_OTHER)
Admission: EM | Admit: 2023-07-26 | Discharge: 2023-07-26 | Disposition: A | Discharge status: Home / Self Care | Payer: Self-pay | Attending: Emergency Medicine | Admitting: Emergency Medicine

## 2023-07-26 ENCOUNTER — Other Ambulatory Visit: Payer: Self-pay

## 2023-07-26 DIAGNOSIS — J181 Lobar pneumonia, unspecified organism: Secondary | ICD-10-CM | POA: Insufficient documentation

## 2023-07-26 DIAGNOSIS — R42 Dizziness and giddiness: Secondary | ICD-10-CM | POA: Insufficient documentation

## 2023-07-26 DIAGNOSIS — J189 Pneumonia, unspecified organism: Secondary | ICD-10-CM

## 2023-07-26 LAB — CBC WITH DIFFERENTIAL/PLATELET
Abs Immature Granulocytes: 0.04 10*3/uL (ref 0.00–0.07)
Basophils Absolute: 0 10*3/uL (ref 0.0–0.1)
Basophils Relative: 1 %
Eosinophils Absolute: 0.1 10*3/uL (ref 0.0–0.5)
Eosinophils Relative: 2 %
HCT: 40.2 % (ref 39.0–52.0)
Hemoglobin: 14 g/dL (ref 13.0–17.0)
Immature Granulocytes: 1 %
Lymphocytes Relative: 29 %
Lymphs Abs: 2.1 10*3/uL (ref 0.7–4.0)
MCH: 30.8 pg (ref 26.0–34.0)
MCHC: 34.8 g/dL (ref 30.0–36.0)
MCV: 88.4 fL (ref 80.0–100.0)
Monocytes Absolute: 0.7 10*3/uL (ref 0.1–1.0)
Monocytes Relative: 10 %
Neutro Abs: 4.3 10*3/uL (ref 1.7–7.7)
Neutrophils Relative %: 57 %
Platelets: 187 10*3/uL (ref 150–400)
RBC: 4.55 MIL/uL (ref 4.22–5.81)
RDW: 11.8 % (ref 11.5–15.5)
WBC: 7.3 10*3/uL (ref 4.0–10.5)
nRBC: 0 % (ref 0.0–0.2)

## 2023-07-26 LAB — BASIC METABOLIC PANEL WITH GFR
Anion gap: 11 (ref 5–15)
BUN: 17 mg/dL (ref 6–20)
CO2: 24 mmol/L (ref 22–32)
Calcium: 8.8 mg/dL — ABNORMAL LOW (ref 8.9–10.3)
Chloride: 105 mmol/L (ref 98–111)
Creatinine, Ser: 1.16 mg/dL (ref 0.61–1.24)
GFR, Estimated: >60 mL/min (ref >60)
Glucose, Bld: 115 mg/dL — ABNORMAL HIGH (ref 70–99)
Potassium: 3.7 mmol/L (ref 3.5–5.1)
Sodium: 140 mmol/L (ref 135–145)

## 2023-07-26 MED ORDER — DOXYCYCLINE HYCLATE 100 MG PO CAPS: 100.0000 mg | ORAL_CAPSULE | Freq: Two times a day (BID) | ORAL | 0 refills | Status: AC

## 2023-07-26 MED ORDER — MECLIZINE HCL 25 MG PO TABS: 25.0000 mg | ORAL_TABLET | Freq: Three times a day (TID) | ORAL | 0 refills | Status: DC | PRN

## 2023-07-26 MED ORDER — LACTATED RINGERS IV BOLUS
1000.0000 mL | Freq: Once | INTRAVENOUS | Status: AC
  Administered 2023-07-26: 1000 mL via INTRAVENOUS

## 2023-07-26 MED ORDER — MECLIZINE HCL 25 MG PO TABS: 25.0000 mg | ORAL_TABLET | Freq: Three times a day (TID) | ORAL | 0 refills | Status: AC | PRN

## 2023-07-26 MED ORDER — DOXYCYCLINE HYCLATE 100 MG PO CAPS: 100.0000 mg | ORAL_CAPSULE | Freq: Two times a day (BID) | ORAL | 0 refills | Status: DC

## 2023-07-26 NOTE — ED Triage Notes (Signed)
 Patient reports intermittent dizziness for 3 days. Reports nose bleed x1 yesterday. Denies shortness of breath or n/v.

## 2023-07-26 NOTE — ED Provider Notes (Signed)
 [place] [facility]  Provider Note  CSN: [id number] Arrival date & time: 07/26/23 0357  History Chief Complaint  Patient presents with   Dizziness    Randall Mckenzie is a 43 y.o. male with no significant PMH reports he has had a cough for the last few days and a nosebleed yesterday. Tonight while he was at work he had an episode of room spinning dizziness and nausea. Symptoms improved after a short while and he was able to drive himself here. At the time of my evaluation he reports he is asymptomatic other than occasional dry cough. He has not had a know fever, but states he has some sweats at night. Denies CP, SOB.    Home Medications Prior to Admission medications   Medication Sig Start Date End Date Taking? Authorizing Provider  doxycycline (VIBRAMYCIN) 100 MG capsule Take 1 capsule (100 mg total) by mouth 2 (two) times daily. 07/26/23  Yes Roselyn Carlin NOVAK, MD  meclizine (ANTIVERT) 25 MG tablet Take 1 tablet (25 mg total) by mouth 3 (three) times daily as needed for dizziness. 07/26/23  Yes Roselyn Carlin NOVAK, MD     Allergies    Patient has no known allergies.   Review of Systems   Review of Systems Please see HPI for pertinent positives and negatives  Physical Exam BP 131/86   Pulse 89   Temp 98.5 F (36.9 C) (Oral)   Resp 17   Ht 6' 2 (1.88 m)   Wt 127 kg   SpO2 96%   BMI 35.95 kg/m   Physical Exam Vitals and nursing note reviewed.  Constitutional:      Appearance: Normal appearance.  HENT:     Head: Normocephalic and atraumatic.     Right Ear: Tympanic membrane normal.     Left Ear: Tympanic membrane normal.     Nose: Nose normal.     Comments: No active bleeding or signs of recent bleed    Mouth/Throat:     Mouth: Mucous membranes are moist.   Eyes:     Extraocular Movements: Extraocular movements intact.     Conjunctiva/sclera: Conjunctivae normal.    Cardiovascular:     Rate and Rhythm: Normal rate.   Pulmonary:     Effort: Pulmonary effort is normal.     Breath sounds: Normal breath sounds.  Abdominal:     General: Abdomen is flat.     Palpations: Abdomen is soft.     Tenderness: There is no abdominal tenderness.   Musculoskeletal:        General: No swelling. Normal range of motion.     Cervical back: Neck supple.   Skin:    General: Skin is warm and dry.   Neurological:     General: No focal deficit present.     Mental Status: He is alert and oriented to person, place, and time.     Cranial Nerves: No cranial nerve deficit.     Sensory: No sensory deficit.     Motor: No weakness.     Coordination: Coordination normal.   Psychiatric:        Mood and Affect: Mood normal.     ED Results / Procedures / Treatments   EKG EKG Interpretation Date/Time:  Saturday July 26 2023 04:08:39 EDT Ventricular Rate:  95 PR Interval:  155 QRS Duration:  85 QT Interval:  356 QTC Calculation: 448 R Axis:   42  Text Interpretation: Sinus rhythm Normal ECG  No old tracing to compare Confirmed by Roselyn Dunnings [phone]) on 07/26/2023 4:10:26 AM  Procedures Procedures  Medications Ordered in the ED Medications  lactated ringers bolus 1,000 mL (1,000 mLs Intravenous New Bag/Given 07/26/23 0429)    Initial Impression and Plan  Patient well appearing here with recent URI symptoms with occasional self-limited nose bleeding and now with an episode of what sounds like vertiginous dizziness. He is feeling better now, exam and vitals are reassuring. Will check labs and give IVF. Reassess for dispo.   ED Course   Clinical Course as of 07/26/23 0528  Sat Jul 26, 2023  0427 CBC is normal.  [CS]  [phone] I personally viewed the images from radiology studies and agree with radiologist interpretation: CXR with early infiltrate vs atelectasis.  [CS]  0500 BMP is unremarkable.  [CS]  [id number] Patient resting comfortably, no further dizzy spells. Given his recent cough and CXR findings with treat for  CAP with Rx for doxycycline. He otherwise is well appearing without hypoxia and not in need for admission. Will also give Rx for meclizine for any further vertigo symptoms. Advised to avoid driving or operating machinery. PCP follow up, RTED for any other concerns.   [CS]    Clinical Course User Index [CS] Roselyn Dunnings NOVAK, MD     MDM Rules/Calculators/A&P Medical Decision Making Given presenting complaint, I considered that admission might be necessary. After review of results from ED lab and/or imaging studies, admission to the hospital is not indicated at this time.    Problems Addressed: Community acquired pneumonia of right lower lobe of lung: acute illness or injury Vertigo: acute illness or injury  Amount and/or Complexity of Data Reviewed Labs: ordered. Decision-making details documented in ED Course. Radiology: ordered and independent interpretation performed. Decision-making details documented in ED Course. ECG/medicine tests: ordered and independent interpretation performed. Decision-making details documented in ED Course.  Risk Prescription drug management. Decision regarding hospitalization.     Final Clinical Impression(s) / ED Diagnoses Final diagnoses:  Vertigo  Community acquired pneumonia of right lower lobe of lung    Rx / DC Orders ED Discharge Orders          Ordered    doxycycline (VIBRAMYCIN) 100 MG capsule  2 times daily        07/26/23 0527    meclizine (ANTIVERT) 25 MG tablet  3 times daily PRN        07/26/23 0527             Roselyn Dunnings NOVAK, MD 07/26/23 [phone]
